# Patient Record
Sex: Male | Born: 2000 | Race: White | Hispanic: No | Marital: Single | Smoking: Never smoker
Health system: Southern US, Community
[De-identification: ages and names within clinical notes are randomized; demographics above are authoritative.]

---
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
## 2019-01-03 ENCOUNTER — Ambulatory Visit (HOSPITAL_COMMUNITY): Payer: Medicaid Other

## 2019-01-03 ENCOUNTER — Other Ambulatory Visit: Payer: Self-pay

## 2019-01-03 ENCOUNTER — Encounter (HOSPITAL_COMMUNITY): Payer: Self-pay | Admitting: Emergency Medicine

## 2019-01-03 ENCOUNTER — Ambulatory Visit (INDEPENDENT_AMBULATORY_CARE_PROVIDER_SITE_OTHER): Payer: Medicaid Other

## 2019-01-03 ENCOUNTER — Ambulatory Visit (HOSPITAL_COMMUNITY)
Admission: EM | Admit: 2019-01-03 | Discharge: 2019-01-03 | Disposition: A | Discharge status: Home / Self Care | Payer: Medicaid Other | Attending: Family Medicine | Admitting: Family Medicine

## 2019-01-03 DIAGNOSIS — B360 Pityriasis versicolor: Secondary | ICD-10-CM

## 2019-01-03 DIAGNOSIS — S61239A Puncture wound without foreign body of unspecified finger without damage to nail, initial encounter: Secondary | ICD-10-CM

## 2019-01-03 MED ORDER — TETANUS-DIPHTH-ACELL PERTUSSIS 5-2.5-18.5 LF-MCG/0.5 IM SUSP
INTRAMUSCULAR | Status: AC
  Filled 2019-01-03: qty 0.5

## 2019-01-03 MED ORDER — TETANUS-DIPHTH-ACELL PERTUSSIS 5-2.5-18.5 LF-MCG/0.5 IM SUSP
0.5000 mL | Freq: Once | INTRAMUSCULAR | Status: AC
  Administered 2019-01-03: 17:00:00 0.5 mL via INTRAMUSCULAR

## 2019-01-03 MED ORDER — FLUCONAZOLE 200 MG PO TABS: 400.0000 mg | ORAL_TABLET | Freq: Once | ORAL | 0 refills | Status: AC

## 2019-01-03 MED ORDER — CEPHALEXIN 500 MG PO CAPS: 500.0000 mg | ORAL_CAPSULE | Freq: Two times a day (BID) | ORAL | 0 refills | Status: AC

## 2019-01-03 NOTE — ED Triage Notes (Signed)
Pt states he punctured his middle finger with a nail gun.

## 2019-01-03 NOTE — Discharge Instructions (Addendum)
Use ibuprofen for pain 

## 2019-01-03 NOTE — ED Provider Notes (Signed)
[facility]    CSN: [id number] Arrival date & time: 01/03/19  1549     History   Chief Complaint Chief Complaint  Patient presents with  . Finger Injury    HPI Richard Waller is a 18 y.o. male.   18 yo man with through and through nail gun injury to middle phalanx of left middle finger today.  It also grazed the distal phalanx of the ring finger.  No significant pain now  He was working on his dog house  Uncertain of last dT  Also, unaware of neck rash.     History reviewed. No pertinent past medical history.  Patient Active Problem List   Diagnosis Date Noted  . Depressed mood 10/07/2018  . Tension headache 01/12/2018  . Migraine without aura and without status migrainosus, not intractable 01/12/2018  . Postconcussion syndrome 06/30/2016    History reviewed. No pertinent surgical history.     Home Medications    Prior to Admission medications   Medication Sig Start Date End Date Taking? Authorizing Provider  b complex vitamins tablet Take 1 tablet by mouth daily.    [provider]  cephALEXin (KEFLEX) 500 MG capsule Take 1 capsule (500 mg total) by mouth 2 (two) times daily. 01/03/19   Elvina SidleLauenstein, Stanely Sexson, MD  fluconazole (DIFLUCAN) 200 MG tablet Take 2 tablets (400 mg total) by mouth once for 1 dose. 01/03/19 01/03/19  Elvina SidleLauenstein, Meridian Scherger, MD  ibuprofen (ADVIL,MOTRIN) 200 MG tablet Take 200 mg by mouth every 6 (six) hours as needed for headache.    [provider]    Family History Family History  Problem Relation Age of Onset  . Migraines Mother   . Depression Mother   . Anxiety disorder Mother   . Migraines Maternal Grandmother   . Epilepsy Maternal Grandmother   . Depression Maternal Grandmother   . Anxiety disorder Maternal Grandmother     Social History Social History   Tobacco Use  . Smoking status: Never Smoker  . Smokeless tobacco: Never Used  Substance Use Topics  . Alcohol use: No  . Drug use: No      Allergies   Patient has no known allergies.   Review of Systems Review of Systems  Skin: Positive for wound.  All other systems reviewed and are negative.    Physical Exam Triage Vital Signs ED Triage Vitals [01/03/19 1613]  Enc Vitals Group     BP (!) 148/95     Pulse Rate 79     Resp 16     Temp 98.4 F (36.9 C)     Temp src      SpO2 100 %     Weight      Height      Head Circumference      Peak Flow      Pain Score      Pain Loc      Pain Edu?      Excl. in GC?    No data found.  Updated Vital Signs BP (!) 148/95   Pulse 79   Temp 98.4 F (36.9 C)   Resp 16   SpO2 100%    Physical Exam Vitals signs and nursing note reviewed.  HENT:     Head: Normocephalic and atraumatic.     Mouth/Throat:     Pharynx: Oropharynx is clear.  Neck:     Musculoskeletal: Normal range of motion and neck supple.  Musculoskeletal: Normal range of motion.  Skin:  General: Skin is warm and dry.     Findings: Rash present.  Neurological:     General: No focal deficit present.     Mental Status: He is alert.     Sensory: No sensory deficit.       u UC Treatments / Results  Labs (all labs ordered are listed, but only abnormal results are displayed) Labs Reviewed - No data to display  EKG None  Radiology Plain film of affected finger: no bony injury seen Procedures Procedures (including critical care time)  Medications Ordered in UC Medications  Tdap (BOOSTRIX) injection 0.5 mL (has no administration in time range)    Initial Impression / Assessment and Plan / UC Course  I have reviewed the triage vital signs and the nursing notes.  Pertinent labs & imaging results that were available during my care of the patient were reviewed by me and considered in my medical decision making (see chart for details).    Final Clinical Impressions(s) / UC Diagnoses   Final diagnoses:  Puncture wound of finger without foreign body without damage to nail, initial  encounter  Tinea versicolor     Discharge Instructions     Use ibuprofen for pain    ED Prescriptions    Medication Sig Dispense Auth. Provider   cephALEXin (KEFLEX) 500 MG capsule Take 1 capsule (500 mg total) by mouth 2 (two) times daily. 10 capsule Elvina Sidle, MD   fluconazole (DIFLUCAN) 200 MG tablet Take 2 tablets (400 mg total) by mouth once for 1 dose. 7 tablet Elvina Sidle, MD     Controlled Substance Prescriptions [place] Controlled Substance Registry consulted? Not Applicable   Elvina Sidle, MD 01/03/19 [phone]

## 2019-02-15 ENCOUNTER — Ambulatory Visit (HOSPITAL_COMMUNITY)
Admission: EM | Admit: 2019-02-15 | Discharge: 2019-02-15 | Disposition: A | Discharge status: Home / Self Care | Payer: Medicaid Other | Attending: Family Medicine | Admitting: Family Medicine

## 2019-02-15 ENCOUNTER — Emergency Department (HOSPITAL_COMMUNITY)
Admission: EM | Admit: 2019-02-15 | Discharge: 2019-02-15 | Discharge status: Against Medical Advice | Payer: Medicaid Other

## 2019-02-15 ENCOUNTER — Encounter (HOSPITAL_COMMUNITY): Payer: Self-pay | Admitting: Emergency Medicine

## 2019-02-15 DIAGNOSIS — W228XXA Striking against or struck by other objects, initial encounter: Secondary | ICD-10-CM | POA: Diagnosis not present

## 2019-02-15 DIAGNOSIS — S0990XA Unspecified injury of head, initial encounter: Secondary | ICD-10-CM

## 2019-02-15 NOTE — ED Triage Notes (Signed)
Pt states two days ago he was lifting a wall and it was heavy and hit him on the top of the head. Denies LOC. Pt also states yesterday he was working outside in the heat and felt overheated. Today hes felt fatigued and "not myself".

## 2019-02-15 NOTE — Discharge Instructions (Signed)
Go to ER

## 2019-02-15 NOTE — ED Provider Notes (Signed)
[facility]    CSN: [id number] Arrival date & time: 02/15/19  1903     History   Chief Complaint Chief Complaint  Patient presents with  . Head Injury  . Fatigue    HPI Richard Waller is a 18 y.o. male. He reports he was hit on the top of the head 2 days ago by a heavy brick wall while at work; denies LOC at that time. Since injury, he feels mentally sluggish, sleepy, headache, and dizzy.  Denies focal weakness, vision changes, SOB, seizures, nausea, vomiting, memory loss, or neck pain.   The history is provided by the patient.    History reviewed. No pertinent past medical history.  Patient Active Problem List   Diagnosis Date Noted  . Depressed mood 10/07/2018  . Tension headache 01/12/2018  . Migraine without aura and without status migrainosus, not intractable 01/12/2018  . Postconcussion syndrome 06/30/2016    History reviewed. No pertinent surgical history.     Home Medications    Prior to Admission medications   Medication Sig Start Date End Date Taking? Authorizing Provider  b complex vitamins tablet Take 1 tablet by mouth daily.    [provider]  cephALEXin (KEFLEX) 500 MG capsule Take 1 capsule (500 mg total) by mouth 2 (two) times daily. 01/03/19   Elvina SidleLauenstein, Kurt, MD  ibuprofen (ADVIL,MOTRIN) 200 MG tablet Take 200 mg by mouth every 6 (six) hours as needed for headache.    [provider]    Family History Family History  Problem Relation Age of Onset  . Migraines Mother   . Depression Mother   . Anxiety disorder Mother   . Migraines Maternal Grandmother   . Epilepsy Maternal Grandmother   . Depression Maternal Grandmother   . Anxiety disorder Maternal Grandmother     Social History Social History   Tobacco Use  . Smoking status: Never Smoker  . Smokeless tobacco: Never Used  Substance Use Topics  . Alcohol use: No  . Drug use: No     Allergies   Patient has no known allergies.   Review of Systems  Review of Systems  Constitutional: Negative for chills and fever.  HENT: Negative for ear pain and sore throat.   Eyes: Negative for pain and visual disturbance.  Respiratory: Negative for cough and shortness of breath.   Cardiovascular: Negative for chest pain and palpitations.  Gastrointestinal: Negative for abdominal pain and vomiting.  Genitourinary: Negative for dysuria and hematuria.  Musculoskeletal: Negative for arthralgias, back pain and neck pain.  Skin: Negative for color change, rash and wound.  Neurological: Positive for dizziness and headaches. Negative for seizures and weakness.  All other systems reviewed and are negative.    Physical Exam Triage Vital Signs ED Triage Vitals  Enc Vitals Group     BP 02/15/19 1924 (!) 142/87     Pulse Rate 02/15/19 1924 80     Resp 02/15/19 1924 18     Temp 02/15/19 1924 99.2 F (37.3 C)     Temp src --      SpO2 02/15/19 1924 100 %     Weight --      Height --      Head Circumference --      Peak Flow --      Pain Score 02/15/19 1926 4     Pain Loc --      Pain Edu? --      Excl. in GC? --  No data found.  Updated Vital Signs BP (!) 142/87   Pulse 80   Temp 99.2 F (37.3 C)   Resp 18   SpO2 100%   Visual Acuity Right Eye Distance:   Left Eye Distance:   Bilateral Distance:    Right Eye Near:   Left Eye Near:    Bilateral Near:     Physical Exam Vitals signs and nursing note reviewed.  Constitutional:      Appearance: He is well-developed.  HENT:     Head: Normocephalic and atraumatic.  Eyes:     Conjunctiva/sclera: Conjunctivae normal.  Neck:     Musculoskeletal: Neck supple.  Cardiovascular:     Rate and Rhythm: Normal rate and regular rhythm.     Heart sounds: No murmur.  Pulmonary:     Effort: Pulmonary effort is normal. No respiratory distress.     Breath sounds: Normal breath sounds.  Abdominal:     Palpations: Abdomen is soft.     Tenderness: There is no abdominal tenderness.  Skin:     General: Skin is warm and dry.  Neurological:     General: No focal deficit present.     Mental Status: He is alert and oriented to person, place, and time.     Cranial Nerves: No cranial nerve deficit.     Sensory: No sensory deficit.     Motor: No weakness.     Coordination: Coordination normal.     Gait: Gait normal.     Deep Tendon Reflexes: Reflexes normal.      UC Treatments / Results  Labs (all labs ordered are listed, but only abnormal results are displayed) Labs Reviewed - No data to display  EKG None  Radiology No results found.  Procedures Procedures (including critical care time)  Medications Ordered in UC Medications - No data to display  Initial Impression / Assessment and Plan / UC Course  I have reviewed the triage vital signs and the nursing notes.  Pertinent labs & imaging results that were available during my care of the patient were reviewed by me and considered in my medical decision making (see chart for details).    Closed head injury after hit in head with brick wall.  Discussed with Dr. Joseph Art and patient sent to ER for evaluation; not appropriate for evaluation in Urgent Care.    Final Clinical Impressions(s) / UC Diagnoses   Final diagnoses:  Closed head injury without loss of consciousness, initial encounter     Discharge Instructions     Go to ER.     ED Prescriptions    None     Controlled Substance Prescriptions [place] Controlled Substance Registry consulted? Not Applicable   Sharion Balloon, NP 02/15/19 2015

## 2019-02-15 NOTE — ED Notes (Signed)
Called patient for Triage x3 and had no answer.

## 2019-02-15 NOTE — ED Notes (Signed)
No answer for triage.
